# Patient Record
Sex: Female | Born: 2000 | Race: Black or African American | Hispanic: No | Marital: Single | Smoking: Never smoker
Health system: Southern US, Community
[De-identification: ages and names within clinical notes are randomized; demographics above are authoritative.]

---
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
## 2019-01-23 ENCOUNTER — Other Ambulatory Visit: Payer: Self-pay | Admitting: Family Medicine

## 2019-01-23 NOTE — Telephone Encounter (Signed)
Please schedule PE and refill until then  

## 2019-01-23 NOTE — Telephone Encounter (Signed)
No recent or future appts., please advise  

## 2019-01-23 NOTE — Telephone Encounter (Signed)
appt scheduled and med refilled. Mother does want to see if insurance will cover a virtual CPE so pt will not have to come into the office if she wants to change it I advised just call back and let us know

## 2019-02-12 ENCOUNTER — Ambulatory Visit (INDEPENDENT_AMBULATORY_CARE_PROVIDER_SITE_OTHER): Payer: Managed Care, Other (non HMO) | Admitting: Family Medicine

## 2019-02-12 ENCOUNTER — Encounter: Payer: Self-pay | Admitting: Family Medicine

## 2019-02-12 ENCOUNTER — Other Ambulatory Visit: Payer: Self-pay

## 2019-02-12 VITALS — BP 122/68 | HR 88 | Temp 98.0°F | Ht 64.75 in | Wt 152.2 lb

## 2019-02-12 DIAGNOSIS — Z113 Encounter for screening for infections with a predominantly sexual mode of transmission: Secondary | ICD-10-CM | POA: Insufficient documentation

## 2019-02-12 DIAGNOSIS — Z0000 Encounter for general adult medical examination without abnormal findings: Secondary | ICD-10-CM

## 2019-02-12 DIAGNOSIS — N926 Irregular menstruation, unspecified: Secondary | ICD-10-CM

## 2019-02-12 MED ORDER — LEVONORGESTREL-ETHINYL ESTRAD 0.1-20 MG-MCG PO TABS: 1.0000 | ORAL_TABLET | Freq: Every day | ORAL | 3 refills | Status: DC

## 2019-02-12 NOTE — Assessment & Plan Note (Signed)
Reviewed health habits including diet and exercise and skin cancer prevention Reviewed appropriate screening tests for age  Also reviewed health mt list, fam hx and immunization status , as well as social and family history   See HPI STD screening ordered (counseled on condom  Use)  Refilled OC Strongly enc HPV vaccine-she will consider it (given handout)  Disc safety at college, fitness, healthy habits and diet , avoid etoh and illicit drugs Non smoker- enc her to continue not to smoke

## 2019-02-12 NOTE — Progress Notes (Signed)
Subjective:    Patient ID: Mary Morrow, female    DOB: 06/27/2001, 18 y.o.   MRN: [id number]  HPI  Here for health maintenance exam and to review chronic medical problems    Weight  Wt Readings from Last 3 Encounters:  02/12/19 152 lb 4 oz (69.1 kg) (83 %, Z= 0.97)*  02/08/18 146 lb 12 oz (66.6 kg) (82 %, Z= 0.90)*  02/07/17 140 lb 12.8 oz (63.9 kg) (79 %, Z= 0.79)*   * Growth percentiles are based on CDC (Girls, 2-20 Years) data.   25.53 kg/m (83 %, Z= 0.94, Source: CDC (Girls, 2-20 Years))   Freshman year at [employer]  Enjoyed her year  Chemistry was hard   Going back to school a week earlier on campus  Unsure if they will transition to online  She is majoring in psychology   Home for the summer - spending time with family  Parents do not want her working outside the house  Would like an at home job   Flu shots -declines in the past   HPV status -has not had the vaccine  Still thinking about getting that   She has been sexually active  Would like std screening  No burning /itching / sore areas  Does use condoms   Tdap 11/10/10  Meningococcal vaccine 6/18   Menses- periods are more regular on OC -likes it  Not heavy/ last 3-4 days  Taking larissa 0.1-20 oral contraceptive   She is still exercising (home and at home) - staying in shape  Did no end up running track   Patient Active Problem List   Diagnosis Date Noted  . Screening examination for STD (sexually transmitted disease) 02/12/2019  . Routine general medical examination at a health care facility 02/08/2018  . Irregular menses 08/05/2014  . Well adolescent visit 08/05/2014   History reviewed. No pertinent past medical history. History reviewed. No pertinent surgical history. Social History   Tobacco Use  . Smoking status: Never Smoker  . Smokeless tobacco: Never Used  Substance Use Topics  . Alcohol use: No    Alcohol/week: 0.0 standard drinks  . Drug use: No   Family History  Problem  Relation Age of Onset  . Hypertension Maternal Grandmother   . Hyperlipidemia Maternal Uncle   . Mental illness Maternal Aunt   . Mental illness Maternal Uncle   . Diabetes Other        maternal great uncle  . Diabetes Maternal Grandfather    No Known Allergies Current Outpatient Medications on File Prior to Visit  Medication Sig Dispense Refill  . cetirizine (ZYRTEC) 10 MG tablet Take 10 mg by mouth daily.    Marland Kitchen loratadine (ALLERGY) 10 MG tablet Take 10 mg by mouth daily. Reported on 10/20/2015     No current facility-administered medications on file prior to visit.      Review of Systems  Constitutional: Negative for activity change, appetite change, fatigue, fever and unexpected weight change.  HENT: Negative for congestion, ear pain, rhinorrhea, sinus pressure and sore throat.   Eyes: Negative for pain, redness and visual disturbance.  Respiratory: Negative for cough, shortness of breath and wheezing.   Cardiovascular: Negative for chest pain and palpitations.  Gastrointestinal: Negative for abdominal pain, blood in stool, constipation and diarrhea.  Endocrine: Negative for polydipsia and polyuria.  Genitourinary: Negative for dysuria, frequency and urgency.       Occ clear vaginal d/c  Musculoskeletal: Negative for arthralgias, back pain and  myalgias.  Skin: Negative for pallor and rash.  Allergic/Immunologic: Negative for environmental allergies.  Neurological: Negative for dizziness, syncope and headaches.  Hematological: Negative for adenopathy. Does not bruise/bleed easily.  Psychiatric/Behavioral: Negative for decreased concentration and dysphoric mood. The patient is not nervous/anxious.        Objective:   Physical Exam Constitutional:      General: She is not in acute distress.    Appearance: Normal appearance. She is well-developed and normal weight.  HENT:     Head: Normocephalic and atraumatic.     Right Ear: Tympanic membrane, ear canal and external ear  normal.     Left Ear: Tympanic membrane, ear canal and external ear normal.     Nose: Nose normal.     Mouth/Throat:     Mouth: Mucous membranes are moist.     Pharynx: Oropharynx is clear. No posterior oropharyngeal erythema.  Eyes:     General: No scleral icterus.       Right eye: No discharge.        Left eye: No discharge.     Conjunctiva/sclera: Conjunctivae normal.     Pupils: Pupils are equal, round, and reactive to light.  Neck:     Musculoskeletal: Normal range of motion and neck supple. No muscular tenderness.     Thyroid: No thyromegaly.     Vascular: No carotid bruit or JVD.  Cardiovascular:     Rate and Rhythm: Normal rate and regular rhythm.     Heart sounds: Normal heart sounds. No gallop.   Pulmonary:     Effort: Pulmonary effort is normal. No respiratory distress.     Breath sounds: Normal breath sounds. No wheezing, rhonchi or rales.  Abdominal:     General: Bowel sounds are normal. There is no distension.     Palpations: Abdomen is soft. There is no mass.     Tenderness: There is no abdominal tenderness.  Musculoskeletal:        General: No tenderness.     Right lower leg: No edema.     Left lower leg: No edema.  Lymphadenopathy:     Cervical: No cervical adenopathy.  Skin:    General: Skin is warm and dry.     Coloration: Skin is not pale.     Findings: No erythema or rash.     Comments: Few brown nevi on back  Neurological:     General: No focal deficit present.     Mental Status: She is alert. Mental status is at baseline.     Cranial Nerves: No cranial nerve deficit.     Motor: No abnormal muscle tone.     Coordination: Coordination normal.     Gait: Gait normal.     Deep Tendon Reflexes: Reflexes are normal and symmetric. Reflexes normal.  Psychiatric:        Mood and Affect: Mood normal.     Comments: Cheerful and talkative           Assessment & Plan:   Problem List Items Addressed This Visit      Other   Irregular menses    Doing  well with current OC Refilled       Routine general medical examination at a health care facility - Primary    Reviewed health habits including diet and exercise and skin cancer prevention Reviewed appropriate screening tests for age  Also reviewed health mt list, fam hx and immunization status , as well as social and family history  See HPI STD screening ordered (counseled on condom  Use)  Refilled OC Strongly enc HPV vaccine-she will consider it (given handout)  Disc safety at college, fitness, healthy habits and diet , avoid etoh and illicit drugs Non smoker- enc her to continue not to smoke        Screening examination for STD (sexually transmitted disease)   Relevant Orders   HIV Antibody (routine testing w rflx)   RPR   C. trachomatis/N. gonorrhoeae RNA

## 2019-02-12 NOTE — Patient Instructions (Signed)
Please think about the HPV vaccine  Take care of yourself   STD screening today  Stay fit and make the effort to drink enough water!   Make sure to get enough rest and keep up a good study routine  Wear a mask in public   Use condoms if you are sexually active to prevent STDs   Continue the oral contraceptive

## 2019-02-12 NOTE — Assessment & Plan Note (Signed)
Doing well with current OC Refilled

## 2019-02-13 LAB — C. TRACHOMATIS/N. GONORRHOEAE RNA
C. trachomatis RNA, TMA: NOT DETECTED
N. gonorrhoeae RNA, TMA: NOT DETECTED

## 2019-02-13 LAB — RPR: RPR Ser Ql: NONREACTIVE

## 2019-02-13 LAB — HIV ANTIBODY (ROUTINE TESTING W REFLEX): HIV 1&2 Ab, 4th Generation: NONREACTIVE

## 2020-01-14 ENCOUNTER — Other Ambulatory Visit: Payer: Self-pay | Admitting: Family Medicine

## 2020-04-05 ENCOUNTER — Other Ambulatory Visit: Payer: Self-pay | Admitting: Family Medicine

## 2020-04-06 ENCOUNTER — Telehealth: Payer: Self-pay | Admitting: Family Medicine

## 2020-04-06 NOTE — Telephone Encounter (Signed)
Pt called wanting to schedule cpx.  She needs this before 8/23 she goes back to school then.  Can she be worked

## 2020-04-06 NOTE — Telephone Encounter (Signed)
Please work her in any 30 minute slot  Thanks

## 2020-04-07 NOTE — Telephone Encounter (Signed)
Patient scheduled appointment on 04/15/20.

## 2020-04-15 ENCOUNTER — Ambulatory Visit (INDEPENDENT_AMBULATORY_CARE_PROVIDER_SITE_OTHER): Payer: Managed Care, Other (non HMO) | Admitting: Family Medicine

## 2020-04-15 ENCOUNTER — Other Ambulatory Visit: Payer: Self-pay

## 2020-04-15 ENCOUNTER — Encounter: Payer: Self-pay | Admitting: Family Medicine

## 2020-04-15 VITALS — BP 98/64 | HR 78 | Temp 97.3°F | Ht 65.0 in | Wt 143.2 lb

## 2020-04-15 DIAGNOSIS — Z23 Encounter for immunization: Secondary | ICD-10-CM | POA: Diagnosis not present

## 2020-04-15 DIAGNOSIS — N926 Irregular menstruation, unspecified: Secondary | ICD-10-CM | POA: Diagnosis not present

## 2020-04-15 DIAGNOSIS — Z0000 Encounter for general adult medical examination without abnormal findings: Secondary | ICD-10-CM | POA: Diagnosis not present

## 2020-04-15 MED ORDER — LEVONORGESTREL-ETHINYL ESTRAD 0.1-20 MG-MCG PO TABS: 1.0000 | ORAL_TABLET | Freq: Every day | ORAL | 11 refills | Status: DC

## 2020-04-15 NOTE — Assessment & Plan Note (Signed)
Doing well with OC  Will continue it  Aware if does not prevent stds  Not a smoker

## 2020-04-15 NOTE — Patient Instructions (Addendum)
HPV vaccine today  Continue your current pill Eat a healthy balanced diet  - get some fruits and veggie  Keep exercising -this helps your brain as well as your body   Stay safe  Don't smoke

## 2020-04-15 NOTE — Assessment & Plan Note (Signed)
Reviewed health habits including diet and exercise and skin cancer prevention Reviewed appropriate screening tests for age  Also reviewed health mt list, fam hx and immunization status , as well as social and family history   See HPI First HPV vaccine given today Pt is covid vaccinated  Encouraged a flu vaccine in the fall  Declines STD screen due to not sexually active  Encouraged good health habits and balanced diet

## 2020-04-15 NOTE — Progress Notes (Signed)
Subjective:    Patient ID: Mary Morrow, female    DOB: [birth year], 19 y.o.   MRN: [id number]  This visit occurred during the SARS-CoV-2 public health emergency.  Safety protocols were in place, including screening questions prior to the visit, additional usage of staff PPE, and extensive cleaning of exam room while observing appropriate contact time as indicated for disinfecting solutions.    HPI Here for health maintenance exam and to review chronic medical problems    Wt Readings from Last 3 Encounters:  04/15/20 143 lb 4 oz (65 kg)  02/12/19 152 lb 4 oz (69.1 kg) (83 %, Z= 0.97)*  02/08/18 146 lb 12 oz (66.6 kg) (82 %, Z= 0.90)*   * Growth percentiles are based on CDC (Girls, 2-20 Years) data.   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  Learned a lot   Returning to [employer] on psychology  In [employer] club  Looking forward to it   She is eating healthy / some sweets  Working out this summer  Lost some [employer] to work out outdoors at school   Is covid vaccinated   Flu shot Tdap 3/12  HPV vaccine- will start the series   Menses - regular and not too heavy or painful  Tolerating them  Taking OC    larissa 1-20- wants to stick with that  Non smoker   HPV status -will get first vaccine today   STD screen -does not need  Not sexually active right now   No new family history   Patient Active Problem List   Diagnosis Date Noted  . Screening examination for STD (sexually transmitted disease) 02/12/2019  . Routine general medical examination at a health care facility 02/08/2018  . Irregular menses 08/05/2014  . Well adolescent visit 08/05/2014   History reviewed. No pertinent past medical history. History reviewed. No pertinent surgical history. Social History   Tobacco Use  . Smoking status: Never Smoker  . Smokeless tobacco: Never Used  Substance Use Topics  . Alcohol use:  No    Alcohol/week: 0.0 standard drinks  . Drug use: No   Family History  Problem Relation Age of Onset  . Hypertension Maternal Grandmother   . Hyperlipidemia Maternal Uncle   . Mental illness Maternal Aunt   . Mental illness Maternal Uncle   . Diabetes Other        maternal great uncle  . Diabetes Maternal Grandfather    No Known Allergies Current Outpatient Medications on File Prior to Visit  Medication Sig Dispense Refill  . loratadine (ALLERGY) 10 MG tablet Take 10 mg by mouth daily. Reported on 10/20/2015     No current facility-administered medications on file prior to visit.    Review of Systems  Constitutional: Negative for activity change, appetite change, fatigue, fever and unexpected weight change.  HENT: Negative for congestion, ear pain, rhinorrhea, sinus pressure and sore throat.   Eyes: Negative for pain, redness and visual disturbance.  Respiratory: Negative for cough, shortness of breath and wheezing.   Cardiovascular: Negative for chest pain and palpitations.  Gastrointestinal: Negative for abdominal pain, blood in stool, constipation and diarrhea.  Endocrine: Negative for polydipsia and polyuria.  Genitourinary: Negative for dysuria, frequency and urgency.  Musculoskeletal: Negative for arthralgias, back pain and myalgias.  Skin: Negative for pallor and rash.  Allergic/Immunologic: Negative for environmental allergies.  Neurological: Negative for dizziness, syncope  and headaches.  Hematological: Negative for adenopathy. Does not bruise/bleed easily.  Psychiatric/Behavioral: Negative for decreased concentration and dysphoric mood. The patient is not nervous/anxious.        Objective:   Physical Exam Constitutional:      General: She is not in acute distress.    Appearance: Normal appearance. She is well-developed and normal weight. She is not ill-appearing or diaphoretic.  HENT:     Head: Normocephalic and atraumatic.     Right Ear: Tympanic membrane,  ear canal and external ear normal.     Left Ear: Tympanic membrane, ear canal and external ear normal.     Nose: Nose normal. No congestion.     Mouth/Throat:     Mouth: Mucous membranes are moist.     Pharynx: Oropharynx is clear. No posterior oropharyngeal erythema.  Eyes:     General: No scleral icterus.    Extraocular Movements: Extraocular movements intact.     Conjunctiva/sclera: Conjunctivae normal.     Pupils: Pupils are equal, round, and reactive to light.  Neck:     Thyroid: No thyromegaly.     Vascular: No carotid bruit or JVD.  Cardiovascular:     Rate and Rhythm: Normal rate and regular rhythm.     Pulses: Normal pulses.     Heart sounds: Normal heart sounds. No gallop.   Pulmonary:     Effort: Pulmonary effort is normal. No respiratory distress.     Breath sounds: Normal breath sounds. No wheezing.     Comments: Good air exch Chest:     Chest wall: No tenderness.  Abdominal:     General: Bowel sounds are normal. There is no distension or abdominal bruit.     Palpations: Abdomen is soft. There is no mass.     Tenderness: There is no abdominal tenderness.     Hernia: No hernia is present.  Musculoskeletal:        General: No tenderness. Normal range of motion.     Cervical back: Normal range of motion and neck supple. No rigidity. No muscular tenderness.     Right lower leg: No edema.     Left lower leg: No edema.  Lymphadenopathy:     Cervical: No cervical adenopathy.  Skin:    General: Skin is warm and dry.     Coloration: Skin is not pale.     Findings: No erythema or rash.  Neurological:     Mental Status: She is alert. Mental status is at baseline.     Cranial Nerves: No cranial nerve deficit.     Motor: No abnormal muscle tone.     Coordination: Coordination normal.     Gait: Gait normal.     Deep Tendon Reflexes: Reflexes are normal and symmetric. Reflexes normal.  Psychiatric:        Mood and Affect: Mood normal.     Comments: Pleasant             Assessment & Plan:   Problem List Items Addressed This Visit      Other   Routine general medical examination at a health care facility - Primary (Chronic)    Reviewed health habits including diet and exercise and skin cancer prevention Reviewed appropriate screening tests for age  Also reviewed health mt list, fam hx and immunization status , as well as social and family history   See HPI First HPV vaccine given today Pt is covid vaccinated  Encouraged a flu vaccine in the fall  Declines STD screen due to not sexually active  Encouraged good health habits and balanced diet        Irregular menses    Doing well with OC  Will continue it  Aware if does not prevent stds  Not a smoker        Other Visit Diagnoses    Need for HPV vaccination       Relevant Orders   HPV 9-valent vaccine,Recombinat (Completed)

## 2020-04-25 ENCOUNTER — Telehealth: Payer: Self-pay | Admitting: Family Medicine

## 2020-04-26 MED ORDER — LEVONORGESTREL-ETHINYL ESTRAD 0.1-20 MG-MCG PO TABS: 1.0000 | ORAL_TABLET | Freq: Every day | ORAL | 11 refills | Status: DC

## 2020-04-26 NOTE — Telephone Encounter (Signed)
Pt called wanting to know why her rx was denied  Pt has 1 more week left of pills  Best number [phone]

## 2020-04-26 NOTE — Telephone Encounter (Signed)
Patient says she is still getting a message from [facility] [place] [facility] that this has not been received yet.  She does want the R/X to be for [facility] location.    I contacted them to see what the problem is as it is still showing a receipt on our end.  They said there must be something wrong with their system because it is not showing up on their end.   I did call in new r/X according to prescription:  Levonorgestrel-ethinyl estradiol Payton Mccallum) 0.1/20mg ,mcg 1 po qd #28, 11 refills.   Patient notified to check with the [place] pharmacy later today to pick up.

## 2020-04-26 NOTE — Telephone Encounter (Signed)
Rx was filled for a year but [facility] [place],  Left VM letting pt know and advise her that all she has to do is call pharmacy and have the xfer Rx [facility] [place] if that's the one she wants to use

## 2020-04-26 NOTE — Addendum Note (Signed)
Addended by: Barrington Ellison C on: 04/26/2020 03:10 PM   Modules accepted: Orders

## 2020-05-10 ENCOUNTER — Telehealth: Payer: Self-pay | Admitting: *Deleted

## 2020-05-10 NOTE — Telephone Encounter (Signed)
Patient's mom called wanting to know how her daughter should go about getting treated for her eczema.  Patient's mom was advised that she is not on the DPR and would have to discuss this with her daughter. Patient got on the phone and stated that she is having a flare-up with her eczema and this has been going on for about a week.. Patient stated that she has had eczema all of her life but has not been treated for this by Dr. Milinda Antis. Patient stated that she is in college and requested a virtual visit with Dr. Milinda Antis. Virtual visit scheduled for Wednesday 05/12/20 at 2:00 pm.  [facility]

## 2020-05-10 NOTE — Telephone Encounter (Signed)
Patient called back to cancel her virtual visit for Wednesday with Dr. Milinda Antis because her insurance will not cover it. Patient stated that she has scheduled a tele health visit thru her insurance so that they will cover it.

## 2020-05-12 ENCOUNTER — Telehealth: Payer: Managed Care, Other (non HMO) | Admitting: Family Medicine

## 2020-06-07 ENCOUNTER — Telehealth: Payer: Self-pay | Admitting: Family Medicine

## 2020-06-07 NOTE — Telephone Encounter (Signed)
Patient scheduled appointment on 06/16/20.

## 2020-06-07 NOTE — Telephone Encounter (Signed)
Pt called to schedule her HPV 2nd dose vaccine.  Please advise.  Thank you!

## 2020-06-07 NOTE — Telephone Encounter (Signed)
Needs appt 2 months after 1st does. 1st dose was on 04/15/20, so anytime after 06/15/20 is okay to schedule

## 2020-06-16 ENCOUNTER — Ambulatory Visit (INDEPENDENT_AMBULATORY_CARE_PROVIDER_SITE_OTHER): Payer: Managed Care, Other (non HMO)

## 2020-06-16 ENCOUNTER — Other Ambulatory Visit: Payer: Self-pay

## 2020-06-16 DIAGNOSIS — Z23 Encounter for immunization: Secondary | ICD-10-CM

## 2020-06-16 NOTE — Progress Notes (Signed)
Per orders of Dr. Roxy Manns, injection of second HPV given by Donnamarie Poag in right deltoid. Patient tolerated injection well. Patient will make appointment at 6 month. She would like to call later to make appointment.   VIS given to patient.

## 2020-12-22 ENCOUNTER — Other Ambulatory Visit: Payer: Self-pay | Admitting: Family Medicine

## 2021-01-06 ENCOUNTER — Ambulatory Visit: Payer: Managed Care, Other (non HMO) | Admitting: Family Medicine

## 2021-01-12 ENCOUNTER — Encounter: Payer: Self-pay | Admitting: Family Medicine

## 2021-01-12 ENCOUNTER — Other Ambulatory Visit: Payer: Self-pay

## 2021-01-12 ENCOUNTER — Ambulatory Visit (INDEPENDENT_AMBULATORY_CARE_PROVIDER_SITE_OTHER): Payer: Managed Care, Other (non HMO) | Admitting: Family Medicine

## 2021-01-12 VITALS — BP 114/62 | HR 67 | Temp 96.9°F | Ht 65.0 in | Wt 145.6 lb

## 2021-01-12 DIAGNOSIS — Z113 Encounter for screening for infections with a predominantly sexual mode of transmission: Secondary | ICD-10-CM

## 2021-01-12 NOTE — Progress Notes (Signed)
Subjective:    Patient ID: Mary Morrow, female    DOB: 2000/09/09, 20 y.o.   MRN: [id number]  This visit occurred during the SARS-CoV-2 public health emergency.  Safety protocols were in place, including screening questions prior to the visit, additional usage of staff PPE, and extensive cleaning of exam room while observing appropriate contact time as indicated for disinfecting solutions.    HPI Pt presents to discuss contraception and STD screening   Wt Readings from Last 3 Encounters:  01/12/21 145 lb 9 oz (66 kg)  04/15/20 143 lb 4 oz (65 kg)  02/12/19 152 lb 4 oz (69.1 kg) (83 %, Z= 0.97)*   * Growth percentiles are based on CDC (Girls, 2-20 Years) data.   24.22 kg/m  She had an incident with a partner who was diagnosed with gonorrhea  This was middle of April  Not using condoms   Had one partner prior  Currently none   She has not had any symptoms  No vaginal discomfort or discharge  Once she urinated and it felt a little funny - but did not happen again  No frequency or urgency   H/o irregular menses  Taking larissa 0.1-20 mg  Has menses right now  Regular and not too long or painful    BP Readings from Last 3 Encounters:  01/12/21 114/62  04/15/20 98/64  02/12/19 122/68   Is HPV immunized -2  Patient Active Problem List   Diagnosis Date Noted  . Screening examination for STD (sexually transmitted disease) 02/12/2019  . Routine general medical examination at a health care facility 02/08/2018  . Irregular menses 08/05/2014  . Well adolescent visit 08/05/2014   No past medical history on file. No past surgical history on file. Social History   Tobacco Use  . Smoking status: Never Smoker  . Smokeless tobacco: Never Used  Substance Use Topics  . Alcohol use: No    Alcohol/week: 0.0 standard drinks  . Drug use: No   Family History  Problem Relation Age of Onset  . Hypertension Maternal Grandmother   . Hyperlipidemia Maternal Uncle   . Mental  illness Maternal Aunt   . Mental illness Maternal Uncle   . Diabetes Other        maternal great uncle  . Diabetes Maternal Grandfather    No Known Allergies Current Outpatient Medications on File Prior to Visit  Medication Sig Dispense Refill  . LARISSIA 0.1-20 MG-MCG tablet TAKE 1 TABLET EVERY DAY 84 tablet 0  . loratadine (CLARITIN) 10 MG tablet Take 10 mg by mouth daily. Reported on 10/20/2015     No current facility-administered medications on file prior to visit.    Review of Systems  Constitutional: Negative for activity change, appetite change, fatigue, fever and unexpected weight change.  HENT: Negative for congestion, ear pain, rhinorrhea, sinus pressure and sore throat.   Eyes: Negative for pain, redness and visual disturbance.  Respiratory: Negative for cough, shortness of breath and wheezing.   Cardiovascular: Negative for chest pain and palpitations.  Gastrointestinal: Negative for abdominal pain, blood in stool, constipation and diarrhea.  Endocrine: Negative for polydipsia and polyuria.  Genitourinary: Negative for difficulty urinating, dysuria, flank pain, frequency, genital sores, hematuria, menstrual problem, pelvic pain, urgency, vaginal discharge and vaginal pain.  Musculoskeletal: Negative for arthralgias, back pain and myalgias.  Skin: Negative for pallor and rash.  Allergic/Immunologic: Negative for environmental allergies.  Neurological: Negative for dizziness, syncope and headaches.  Hematological: Negative for adenopathy. Does not bruise/bleed  easily.  Psychiatric/Behavioral: Negative for decreased concentration and dysphoric mood. The patient is not nervous/anxious.        Objective:   Physical Exam Constitutional:      General: She is not in acute distress.    Appearance: Normal appearance. She is normal weight. She is not ill-appearing.  Eyes:     Conjunctiva/sclera: Conjunctivae normal.     Pupils: Pupils are equal, round, and reactive to light.   Cardiovascular:     Rate and Rhythm: Normal rate and regular rhythm.     Heart sounds: Normal heart sounds.  Pulmonary:     Effort: No respiratory distress.  Abdominal:     General: Abdomen is flat. Bowel sounds are normal. There is no distension.     Palpations: Abdomen is soft. There is no mass.     Tenderness: There is no abdominal tenderness.     Comments: No suprapubic tenderness or fullness    Musculoskeletal:     Cervical back: Neck supple.  Lymphadenopathy:     Cervical: No cervical adenopathy.  Skin:    Findings: No erythema or rash.  Neurological:     Mental Status: She is alert.  Psychiatric:        Mood and Affect: Mood normal.           Assessment & Plan:   Problem List Items Addressed This Visit      Other   Screening examination for STD (sexually transmitted disease) - Primary    Pt desires screening  2 lifetime partners (none since April)  ? If exp to gonorrhea Disc screening  Gc/chl and HIV, hep C and RPR today  Disc s/s to watch for   Is HPV immunized  Due for annual in aug -will need first pap since she will be 20  Disc safe sexual practices/condom use  Continues OC      Relevant Orders   RPR   HIV Antibody (routine testing w rflx)   Hepatitis C antibody   C. trachomatis/N. gonorrhoeae RNA

## 2021-01-12 NOTE — Assessment & Plan Note (Signed)
Pt desires screening  2 lifetime partners (none since April)  ? If exp to gonorrhea Disc screening  Gc/chl and HIV, hep C and RPR today  Disc s/s to watch for   Is HPV immunized  Due for annual in aug -will need first pap since she will be 20  Disc safe sexual practices/condom use  Continues OC

## 2021-01-12 NOTE — Patient Instructions (Addendum)
Schedule your yearly visit after august 19th  We will start doing pap tests now at age 20   STD screening today  Please use condoms if sexually active

## 2021-01-13 LAB — HEPATITIS C ANTIBODY
Hepatitis C Ab: NONREACTIVE
SIGNAL TO CUT-OFF: 0.04 (ref <1.00)

## 2021-01-13 LAB — RPR: RPR Ser Ql: NONREACTIVE

## 2021-01-13 LAB — C. TRACHOMATIS/N. GONORRHOEAE RNA
C. trachomatis RNA, TMA: DETECTED — AB
N. gonorrhoeae RNA, TMA: DETECTED — AB

## 2021-01-13 LAB — HIV ANTIBODY (ROUTINE TESTING W REFLEX): HIV 1&2 Ab, 4th Generation: NONREACTIVE

## 2021-01-13 MED ORDER — DOXYCYCLINE HYCLATE 100 MG PO TABS: 100.0000 mg | ORAL_TABLET | Freq: Two times a day (BID) | ORAL | 0 refills | Status: DC

## 2021-01-13 NOTE — Addendum Note (Signed)
Addended by: Roena Malady on: 01/13/2021 04:14 PM   Modules accepted: Orders

## 2021-01-14 ENCOUNTER — Ambulatory Visit (INDEPENDENT_AMBULATORY_CARE_PROVIDER_SITE_OTHER): Payer: Managed Care, Other (non HMO)

## 2021-01-14 ENCOUNTER — Other Ambulatory Visit: Payer: Self-pay

## 2021-01-14 DIAGNOSIS — A549 Gonococcal infection, unspecified: Secondary | ICD-10-CM | POA: Diagnosis not present

## 2021-01-14 MED ORDER — CEFTRIAXONE SODIUM 250 MG IJ SOLR
500.0000 mg | Freq: Once | INTRAMUSCULAR | Status: AC
  Administered 2021-01-14: 500 mg via INTRAMUSCULAR

## 2021-01-14 NOTE — Progress Notes (Signed)
Per orders of Dr. Milinda Antis , injection of Rocephin 500 mg  given by Roena Malady. In RUOQ  Patient tolerated injection well.

## 2021-02-14 ENCOUNTER — Ambulatory Visit: Payer: Managed Care, Other (non HMO) | Admitting: Family Medicine

## 2021-02-14 ENCOUNTER — Other Ambulatory Visit: Payer: Self-pay

## 2021-02-14 ENCOUNTER — Ambulatory Visit (INDEPENDENT_AMBULATORY_CARE_PROVIDER_SITE_OTHER): Payer: Managed Care, Other (non HMO) | Admitting: Family Medicine

## 2021-02-14 ENCOUNTER — Encounter: Payer: Self-pay | Admitting: Family Medicine

## 2021-02-14 VITALS — BP 96/64 | HR 78 | Temp 98.7°F | Ht 65.0 in | Wt 143.0 lb

## 2021-02-14 DIAGNOSIS — Z113 Encounter for screening for infections with a predominantly sexual mode of transmission: Secondary | ICD-10-CM

## 2021-02-14 DIAGNOSIS — N926 Irregular menstruation, unspecified: Secondary | ICD-10-CM

## 2021-02-14 MED ORDER — LEVONORGESTREL-ETHINYL ESTRAD 0.1-20 MG-MCG PO TABS
1.0000 | ORAL_TABLET | Freq: Every day | ORAL | 3 refills | Status: DC
Start: 2021-02-14 — End: 2021-11-23

## 2021-02-14 NOTE — Patient Instructions (Signed)
Use condoms   Re test for gonorrhea and chlamydia   Follow up for first pap when you can

## 2021-02-14 NOTE — Progress Notes (Signed)
Subjective:    Patient ID: Mary Morrow, female    DOB: 2000/09/02, 20 y.o.   MRN: [id number]  This visit occurred during the SARS-CoV-2 public health emergency.  Safety protocols were in place, including screening questions prior to the visit, additional usage of staff PPE, and extensive cleaning of exam room while observing appropriate contact time as indicated for disinfecting solutions.   HPI Pt presents for STD screening   Wt Readings from Last 3 Encounters:  02/14/21 143 lb (64.9 kg)  01/12/21 145 lb 9 oz (66 kg)  04/15/20 143 lb 4 oz (65 kg)   23.80 kg/m    At visit a month ago- screen was positive for GC and chlamydia  Treated with rocephin IM injection 500 mg and doxycycline 100 mg bid   Feels back to normal now after treating  Her partner got test and no longer in the picture  [place] OC -doing well with that  Periods are regular  Due for first pap but can't stay today as she will be late for work   Will return later this summer  Office Visit on 01/12/2021  Component Date Value Ref Range Status   RPR Ser Ql 01/12/2021 NON-REACTIVE  NON-REACTIVE Final   HIV 1&2 Ab, 4th Generation 01/12/2021 NON-REACTIVE  NON-REACTIVE Final   Comment: HIV-1 antigen and HIV-1/HIV-2 antibodies were not detected. There is no laboratory evidence of HIV infection. Marland Kitchen PLEASE NOTE: This information has been disclosed to you from records whose confidentiality may be protected by state law.  If your state requires such protection, then the state law prohibits you from making any further disclosure of the information without the specific written consent of the person to whom it pertains, or as otherwise permitted by law. A general authorization for the release of medical or other information is NOT sufficient for this purpose. . For additional information please refer to http://education.quest[facility].com/faq/FAQ106 (This link is being provided for informational/ educational  purposes only.) . Marland Kitchen The performance of this assay has not been clinically validated in patients less than 55 years old. .    Hepatitis C Ab 01/12/2021 NON-REACTIVE  NON-REACTIVE Final   SIGNAL TO CUT-OFF 01/12/2021 0.04  <1.00 Final   Comment: . HCV antibody was non-reactive. There is no laboratory  evidence of HCV infection. . In most cases, no further action is required. However, if recent HCV exposure is suspected, a test for HCV RNA (test code 73220) is suggested. . For additional information please refer to http://education.[facility].com/faq/FAQ22v1 (This link is being provided for informational/ educational purposes only.) .    C. trachomatis RNA, TMA 01/12/2021 DETECTED (A) NOT DETECTED Final   Comment: . If results do not correlate with clinical findings, testing using an alternate molecular target which amplifies different genetic sequences can be performed on the same sample for result confirmation within 7 days of sample receipt or per performing laboratory specimen retention policy. Alternate target testing is available; 15031 (C. trachomatis) or 25427 (N. gonorrhoeae). .    N. gonorrhoeae RNA, TMA 01/12/2021 DETECTED (A) NOT DETECTED Final   Comment: . If results do not correlate with clinical findings, testing using an alternate molecular target which amplifies different genetic sequences can be performed on the same sample for result confirmation within 7 days of sample receipt or per performing laboratory specimen retention policy. Alternate target testing is available; 15031 (C. trachomatis) or 06237 (N. gonorrhoeae). . The analytical performance characteristics of this assay, when used to test SurePath(TM) specimens have been determined  by [employer]. The modifications have not been cleared or approved by the FDA. This assay has been validated pursuant to the CLIA regulations and is used for clinical purposes. . For additional  information, please refer to https://education.quest[facility].com/faq/FAQ154 (This link is being provided for information/ educational purposes only.) .      Patient Active Problem List   Diagnosis Date Noted   Screening examination for STD (sexually transmitted disease) 02/12/2019   Routine general medical examination at a health care facility 02/08/2018   Irregular menses 08/05/2014   Well adolescent visit 08/05/2014   History reviewed. No pertinent past medical history. History reviewed. No pertinent surgical history. Social History   Tobacco Use   Smoking status: Never   Smokeless tobacco: Never  Substance Use Topics   Alcohol use: No    Alcohol/week: 0.0 standard drinks   Drug use: No   Family History  Problem Relation Age of Onset   Hypertension Maternal Grandmother    Hyperlipidemia Maternal Uncle    Mental illness Maternal Aunt    Mental illness Maternal Uncle    Diabetes Other        maternal great uncle   Diabetes Maternal Grandfather    No Known Allergies No current outpatient medications on file prior to visit.   No current facility-administered medications on file prior to visit.     Review of Systems  Constitutional:  Negative for activity change, appetite change, fatigue, fever and unexpected weight change.  HENT:  Negative for congestion, ear pain, rhinorrhea, sinus pressure and sore throat.   Eyes:  Negative for pain, redness and visual disturbance.  Respiratory:  Negative for cough, shortness of breath and wheezing.   Cardiovascular:  Negative for chest pain and palpitations.  Gastrointestinal:  Negative for abdominal pain, blood in stool, constipation and diarrhea.  Endocrine: Negative for polydipsia and polyuria.  Genitourinary:  Negative for dysuria, frequency, urgency, vaginal bleeding, vaginal discharge and vaginal pain.  Musculoskeletal:  Negative for arthralgias, back pain and myalgias.  Skin:  Negative for pallor and rash.   Allergic/Immunologic: Negative for environmental allergies.  Neurological:  Negative for dizziness, syncope and headaches.  Hematological:  Negative for adenopathy. Does not bruise/bleed easily.  Psychiatric/Behavioral:  Negative for decreased concentration and dysphoric mood. The patient is not nervous/anxious.       Objective:   Physical Exam Constitutional:      General: She is not in acute distress.    Appearance: Normal appearance. She is normal weight. She is not ill-appearing.  Eyes:     General: No scleral icterus.    Conjunctiva/sclera: Conjunctivae normal.     Pupils: Pupils are equal, round, and reactive to light.  Cardiovascular:     Rate and Rhythm: Normal rate and regular rhythm.     Heart sounds: Normal heart sounds.  Pulmonary:     Effort: Pulmonary effort is normal. No respiratory distress.  Abdominal:     General: Abdomen is flat. Bowel sounds are normal.     Comments: No suprapubic tenderness or fullness    Musculoskeletal:     Cervical back: Normal range of motion and neck supple.  Lymphadenopathy:     Cervical: No cervical adenopathy.  Skin:    Coloration: Skin is not pale.     Findings: No rash.  Neurological:     Mental Status: She is alert.  Psychiatric:        Mood and Affect: Mood normal.          Assessment &  Plan:   Problem List Items Addressed This Visit       Other   Irregular menses    Much improved with the larissa OC         Screening examination for STD (sexually transmitted disease) - Primary    Last visit treated for gc and chlamydia Other tests neg  Was given rocephin and a week of doxycycline  Encouraged condom use  Not active right now  Will f/u for gyn /pap later in summer/ is due at age 46        Relevant Orders   C. trachomatis/N. gonorrhoeae RNA

## 2021-02-14 NOTE — Assessment & Plan Note (Signed)
Much improved with the larissa OC

## 2021-02-14 NOTE — Assessment & Plan Note (Signed)
Last visit treated for gc and chlamydia Other tests neg  Was given rocephin and a week of doxycycline  Encouraged condom use  Not active right now  Will f/u for gyn /pap later in summer/ is due at age 20

## 2021-02-15 LAB — C. TRACHOMATIS/N. GONORRHOEAE RNA
C. trachomatis RNA, TMA: NOT DETECTED
N. gonorrhoeae RNA, TMA: NOT DETECTED

## 2021-04-19 ENCOUNTER — Encounter: Payer: Managed Care, Other (non HMO) | Admitting: Family Medicine

## 2021-11-23 ENCOUNTER — Other Ambulatory Visit: Payer: Self-pay | Admitting: Family Medicine

## 2022-02-20 ENCOUNTER — Other Ambulatory Visit: Payer: Self-pay | Admitting: Family Medicine

## 2022-04-20 ENCOUNTER — Encounter: Payer: Managed Care, Other (non HMO) | Admitting: Family Medicine

## 2022-05-08 ENCOUNTER — Encounter: Payer: Self-pay | Admitting: Family Medicine

## 2022-05-08 ENCOUNTER — Ambulatory Visit (INDEPENDENT_AMBULATORY_CARE_PROVIDER_SITE_OTHER): Payer: Managed Care, Other (non HMO) | Admitting: Family Medicine

## 2022-05-08 ENCOUNTER — Other Ambulatory Visit (HOSPITAL_COMMUNITY)
Admission: RE | Admit: 2022-05-08 | Discharge: 2022-05-08 | Disposition: A | Discharge status: Home / Self Care | Payer: Managed Care, Other (non HMO) | Source: Ambulatory Visit | Attending: Family Medicine | Admitting: Family Medicine

## 2022-05-08 VITALS — BP 126/68 | HR 70 | Temp 98.0°F | Ht 65.0 in | Wt 148.1 lb

## 2022-05-08 DIAGNOSIS — Z01419 Encounter for gynecological examination (general) (routine) without abnormal findings: Secondary | ICD-10-CM | POA: Insufficient documentation

## 2022-05-08 DIAGNOSIS — Z23 Encounter for immunization: Secondary | ICD-10-CM

## 2022-05-08 DIAGNOSIS — Z0000 Encounter for general adult medical examination without abnormal findings: Secondary | ICD-10-CM | POA: Diagnosis not present

## 2022-05-08 DIAGNOSIS — D696 Thrombocytopenia, unspecified: Secondary | ICD-10-CM | POA: Insufficient documentation

## 2022-05-08 LAB — COMPREHENSIVE METABOLIC PANEL
ALT: 21 U/L (ref 0–35)
AST: 25 U/L (ref 0–37)
Albumin: 4.1 g/dL (ref 3.5–5.2)
Alkaline Phosphatase: 41 U/L (ref 39–117)
BUN: 10 mg/dL (ref 6–23)
CO2: 26 mEq/L (ref 19–32)
Calcium: 9.7 mg/dL (ref 8.4–10.5)
Chloride: 103 mEq/L (ref 96–112)
Creatinine, Ser: 0.93 mg/dL (ref 0.40–1.20)
GFR: 87.37 mL/min (ref >60.00)
Glucose, Bld: 73 mg/dL (ref 70–99)
Potassium: 3.9 mEq/L (ref 3.5–5.1)
Sodium: 135 mEq/L (ref 135–145)
Total Bilirubin: 0.5 mg/dL (ref 0.2–1.2)
Total Protein: 7.6 g/dL (ref 6.0–8.3)

## 2022-05-08 LAB — LIPID PANEL
Cholesterol: 181 mg/dL (ref 0–200)
HDL: 64.4 mg/dL (ref >39.00)
LDL Cholesterol: 101 mg/dL — ABNORMAL HIGH (ref 0–99)
NonHDL: 116.41
Total CHOL/HDL Ratio: 3
Triglycerides: 79 mg/dL (ref 0.0–149.0)
VLDL: 15.8 mg/dL (ref 0.0–40.0)

## 2022-05-08 LAB — CBC WITH DIFFERENTIAL/PLATELET
Basophils Absolute: 0 10*3/uL (ref 0.0–0.1)
Basophils Relative: 0.5 % (ref 0.0–3.0)
Eosinophils Absolute: 0.1 10*3/uL (ref 0.0–0.7)
Eosinophils Relative: 2.8 % (ref 0.0–5.0)
HCT: 37.9 % (ref 36.0–46.0)
Hemoglobin: 12.6 g/dL (ref 12.0–15.0)
Lymphocytes Relative: 48.3 % — ABNORMAL HIGH (ref 12.0–46.0)
Lymphs Abs: 1.6 10*3/uL (ref 0.7–4.0)
MCHC: 33.2 g/dL (ref 30.0–36.0)
MCV: 90.5 fl (ref 78.0–100.0)
Monocytes Absolute: 0.2 10*3/uL (ref 0.1–1.0)
Monocytes Relative: 5.7 % (ref 3.0–12.0)
Neutro Abs: 1.4 10*3/uL (ref 1.4–7.7)
Neutrophils Relative %: 42.7 % — ABNORMAL LOW (ref 43.0–77.0)
Platelets: 136 10*3/uL — ABNORMAL LOW (ref 150.0–400.0)
RBC: 4.19 Mil/uL (ref 3.87–5.11)
RDW: 13.3 % (ref 11.5–15.5)
WBC: 3.3 10*3/uL — ABNORMAL LOW (ref 4.0–10.5)

## 2022-05-08 LAB — TSH: TSH: 1.99 u[IU]/mL (ref 0.35–5.50)

## 2022-05-08 MED ORDER — LEVONORGESTREL-ETHINYL ESTRAD 0.1-20 MG-MCG PO TABS: 1.0000 | ORAL_TABLET | Freq: Every day | ORAL | 3 refills | Status: DC

## 2022-05-08 NOTE — Patient Instructions (Addendum)
HPV  vaccine today  Tdap vaccine today   Pap today  You will get a result in the next 1-2 weeks   Lab today   Try to eat a balance diet and get exercise  Use sun protection   Start back on your oral contraceptive   Use condoms for STD prevention

## 2022-05-08 NOTE — Assessment & Plan Note (Signed)
Reviewed health habits including diet and exercise and skin cancer prevention Reviewed appropriate screening tests for age  Also reviewed health mt list, fam hx and immunization status , as well as social and family history   See HPI Labs ordered  HPV vaccine and Tdap updated today  Declines flu shot  Fist pap and gyn exam today  Declines STD screening  Encouraged good self care

## 2022-05-08 NOTE — Progress Notes (Signed)
Subjective:    Patient ID: Mary Morrow, female    DOB: [birth year], 21 y.o.   MRN: [id number]  HPI Here for health maintenance exam and to review chronic medical problems    Wt Readings from Last 3 Encounters:  05/08/22 148 lb 2 oz (67.2 kg)  02/14/21 143 lb (64.9 kg)  01/12/21 145 lb 9 oz (66 kg)   24.65 kg/m  Recently graduated-thrilled / majored in psychology  Looking for a job  (interested in internship- in Physicist, medical)   Still living with her parents  Wants to continue school in future as well  Interested in behavioral med /would like to be a Investment banker, operational  Social work is another option    Taking fair care of herself  Has not worked out in a while (really likes it)  Can work out at home- wants to get started/getting motivated   Works as a Programme researcher, broadcasting/film/video- and gets her steps in   [place]- whatever is at home  Lot of grains  Balanced meals  Fruits and veggies     Immunization History  Administered Date(s) Administered   DTaP 03/19/2000, 05/22/2000, 07/27/2000, 04/23/2001, 10/17/2004   HIB (PRP-OMP) 03/19/2000, 05/22/2000, 07/27/2000, 04/23/2001   HPV 9-valent 04/15/2020, 06/16/2020   Hepatitis A, Ped/Adol-2 Dose 10/02/2007, 11/10/2010   Hepatitis B, PED/ADOLESCENT 12/25/1999, 02/20/2000, 10/23/2000   IPV 03/19/2000, 05/22/2000, 10/23/2000, 10/17/2004   MMR 01/18/2001, 10/17/2004   Meningococcal Mcv4o 02/07/2017   PFIZER(Purple Top)SARS-COV-2 Vaccination 02/10/2020, 03/02/2020   Pneumococcal Conjugate-13 03/19/2000, 05/22/2000, 07/27/2000, 01/18/2001   Tdap 11/10/2010   Varicella 01/18/2001, 10/17/2004, 10/02/2006   Health Maintenance Due  Topic Date Due   COVID-19 Vaccine (3 - Pfizer series) 04/27/2020   HPV VACCINES (3 - 3-dose series) 10/17/2020   TETANUS/TDAP  11/09/2020   PAP-Cervical Cytology Screening  Never done   PAP SMEAR-Modifier  Never done   INFLUENZA VACCINE  Never done   Flu shot -declines    HPV -had 2 vaccines   Tdap 10/2010-  due   Pap -has not had  Not on period  Not sexually active /last in feb   Menses: ok, out of pill for 2 wk  Was on OC in past - would like to re start  Regular  5-6 d  Not too heavy   STD screening - declines   BP Readings from Last 3 Encounters:  05/08/22 126/68  02/14/21 96/64  01/12/21 114/62   Pulse Readings from Last 3 Encounters:  05/08/22 70  02/14/21 78  01/12/21 67   Patient Active Problem List   Diagnosis Date Noted   Encounter for routine gynecological examination 05/08/2022   Screening examination for STD (sexually transmitted disease) 02/12/2019   Routine general medical examination at a health care facility 02/08/2018   Irregular menses 08/05/2014   Well adolescent visit 08/05/2014   No past medical history on file. No past surgical history on file. Social History   Tobacco Use   Smoking status: Never   Smokeless tobacco: Never  Substance Use Topics   Alcohol use: No    Alcohol/week: 0.0 standard drinks of alcohol   Drug use: No   Family History  Problem Relation Age of Onset   Hypertension Maternal Grandmother    Hyperlipidemia Maternal Uncle    Mental illness Maternal Aunt    Mental illness Maternal Uncle    Diabetes Other        maternal great uncle   Diabetes Maternal Grandfather    No Known Allergies No current  outpatient medications on file prior to visit.   No current facility-administered medications on file prior to visit.      Review of Systems  Constitutional:  Negative for activity change, appetite change, fatigue, fever and unexpected weight change.  HENT:  Negative for congestion, ear pain, rhinorrhea, sinus pressure and sore throat.   Eyes:  Negative for pain, redness and visual disturbance.  Respiratory:  Negative for cough, shortness of breath and wheezing.   Cardiovascular:  Negative for chest pain and palpitations.  Gastrointestinal:  Negative for abdominal pain, blood in stool, constipation and diarrhea.   Endocrine: Negative for polydipsia and polyuria.  Genitourinary:  Negative for dysuria, frequency and urgency.  Musculoskeletal:  Negative for arthralgias, back pain and myalgias.  Skin:  Negative for pallor and rash.  Allergic/Immunologic: Negative for environmental allergies.  Neurological:  Negative for dizziness, syncope and headaches.  Hematological:  Negative for adenopathy. Does not bruise/bleed easily.  Psychiatric/Behavioral:  Negative for decreased concentration and dysphoric mood. The patient is not nervous/anxious.        Objective:   Physical Exam Constitutional:      General: She is not in acute distress.    Appearance: Normal appearance. She is well-developed and normal weight. She is not ill-appearing or diaphoretic.  HENT:     Head: Normocephalic and atraumatic.     Right Ear: Tympanic membrane, ear canal and external ear normal.     Left Ear: Tympanic membrane, ear canal and external ear normal.     Nose: Nose normal. No congestion.     Mouth/Throat:     Mouth: Mucous membranes are moist.     Pharynx: Oropharynx is clear. No posterior oropharyngeal erythema.  Eyes:     General: No scleral icterus.    Extraocular Movements: Extraocular movements intact.     Conjunctiva/sclera: Conjunctivae normal.     Pupils: Pupils are equal, round, and reactive to light.  Neck:     Thyroid: No thyromegaly.     Vascular: No carotid bruit or JVD.  Cardiovascular:     Rate and Rhythm: Normal rate and regular rhythm.     Pulses: Normal pulses.     Heart sounds: Normal heart sounds.     No gallop.  Pulmonary:     Effort: Pulmonary effort is normal. No respiratory distress.     Breath sounds: Normal breath sounds. No wheezing.     Comments: Good air exch Chest:     Chest wall: No tenderness.  Abdominal:     General: Bowel sounds are normal. There is no distension or abdominal bruit.     Palpations: Abdomen is soft. There is no mass.     Tenderness: There is no abdominal  tenderness.     Hernia: No hernia is present.  Genitourinary:    Comments: Breast exam: No mass, nodules, thickening, tenderness, bulging, retraction, inflamation, nipple discharge or skin changes noted.  No axillary or clavicular LA.                   Anus appears normal w/o hemorrhoids or masses       External genitalia : nl appearance and hair distribution/no lesions       Urethral meatus : nl size, no lesions or prolapse       Urethra: no masses, tenderness or scarring      Bladder : no masses or tenderness       Vagina: nl general appearance, no discharge or  Lesions, no significant cystocele  or rectocele       Cervix: incompletely visualized due to pt tensing up during exam      Uterus: nl size, contour, position, and mobility (not fixed) , non tender      Adnexa : no masses, tenderness, enlargement or nodularity          Musculoskeletal:        General: No tenderness. Normal range of motion.     Cervical back: Normal range of motion and neck supple. No rigidity. No muscular tenderness.     Right lower leg: No edema.     Left lower leg: No edema.     Comments: No kyphosis   Lymphadenopathy:     Cervical: No cervical adenopathy.  Skin:    General: Skin is warm and dry.     Coloration: Skin is not pale.     Findings: No erythema or rash.  Neurological:     Mental Status: She is alert. Mental status is at baseline.     Cranial Nerves: No cranial nerve deficit.     Motor: No abnormal muscle tone.     Coordination: Coordination normal.     Gait: Gait normal.     Deep Tendon Reflexes: Reflexes are normal and symmetric.  Psychiatric:        Mood and Affect: Mood normal.        Cognition and Memory: Cognition and memory normal.     Comments: Pt became anxious during pelvic exam and tensed up   Otherwise nl affect            Assessment & Plan:   Problem List Items Addressed This Visit       Other   Routine general medical examination at a health care facility -  Primary (Chronic)    Reviewed health habits including diet and exercise and skin cancer prevention Reviewed appropriate screening tests for age  Also reviewed health mt list, fam hx and immunization status , as well as social and family history   See HPI Labs ordered  HPV vaccine and Tdap updated today  Declines flu shot  Fist pap and gyn exam today  Declines STD screening  Encouraged good self care       Relevant Orders   TSH (Completed)   Lipid panel (Completed)   Comprehensive metabolic panel (Completed)   CBC with Differential/Platelet (Completed)   HPV 9-valent vaccine,Recombinat (Completed)   Tdap vaccine greater than or equal to 7yo IM (Completed)   Encounter for routine gynecological examination    Exam and first pap today  Will continue 1/20 oral contraceptive Pt declines STD screening  Recommend condom use for STD prevention in the future       Relevant Orders   Cytology - PAP([facility])   Other Visit Diagnoses     Need for HPV vaccination       Relevant Orders   HPV 9-valent vaccine,Recombinat (Completed)   Need for Tdap vaccination       Relevant Orders   Tdap vaccine greater than or equal to 7yo IM (Completed)

## 2022-05-08 NOTE — Assessment & Plan Note (Signed)
Exam and first pap today  Will continue 1/20 oral contraceptive Pt declines STD screening  Recommend condom use for STD prevention in the future

## 2022-05-10 ENCOUNTER — Encounter: Payer: Self-pay | Admitting: *Deleted

## 2022-05-10 ENCOUNTER — Telehealth: Payer: Self-pay | Admitting: *Deleted

## 2022-05-10 NOTE — Telephone Encounter (Signed)
-----   Message from Judy Pimple, MD sent at 05/08/2022  7:53 PM EDT ----- Cholesterol is good  Your platelet count (part of blood that is responsible for clotting) is slightly low and I want to watch it  Let me know if you have any issues with bleeding or bruising  I want to re check this in about a month with an iron level  Other labs look normal

## 2022-05-10 NOTE — Telephone Encounter (Signed)
Left VM requesting pt to call the office back 

## 2022-05-15 LAB — CYTOLOGY - PAP
Adequacy: ABSENT
Comment: NEGATIVE
High risk HPV: POSITIVE — AB

## 2022-05-17 ENCOUNTER — Telehealth: Payer: Self-pay | Admitting: Family Medicine

## 2022-05-17 DIAGNOSIS — R87612 Low grade squamous intraepithelial lesion on cytologic smear of cervix (LGSIL): Secondary | ICD-10-CM | POA: Insufficient documentation

## 2022-05-17 NOTE — Telephone Encounter (Signed)
-----   Message from Tammi Sou, [place] sent at 05/17/2022  4:20 PM EDT ----- Pt notified of pap smear results and Dr. Marliss Coots comments. Pt agreed with referral to GYN she would like to see someone in [place]. Pt advised referrals can take up to 2 weeks but if she hasn't heard anything in 2 weeks to let us know.

## 2022-05-18 NOTE — Telephone Encounter (Signed)
Addressed through result notes  

## 2022-06-15 ENCOUNTER — Encounter: Payer: Self-pay | Admitting: Obstetrics and Gynecology

## 2022-09-14 ENCOUNTER — Ambulatory Visit: Payer: Managed Care, Other (non HMO) | Admitting: Obstetrics & Gynecology

## 2022-09-14 ENCOUNTER — Encounter: Payer: Self-pay | Admitting: Obstetrics & Gynecology

## 2022-09-14 VITALS — BP 110/70 | Ht 65.0 in | Wt 143.0 lb

## 2022-09-14 DIAGNOSIS — Z3200 Encounter for pregnancy test, result unknown: Secondary | ICD-10-CM

## 2022-09-14 DIAGNOSIS — B977 Papillomavirus as the cause of diseases classified elsewhere: Secondary | ICD-10-CM | POA: Diagnosis not present

## 2022-09-14 DIAGNOSIS — Z3202 Encounter for pregnancy test, result negative: Secondary | ICD-10-CM

## 2022-09-14 DIAGNOSIS — R87612 Low grade squamous intraepithelial lesion on cytologic smear of cervix (LGSIL): Secondary | ICD-10-CM

## 2022-09-14 LAB — POCT URINE PREGNANCY: Preg Test, Ur: NEGATIVE

## 2022-09-14 NOTE — Progress Notes (Signed)
   Established Patient Office Visit  Subjective   Patient ID: Mary Morrow, female    DOB: 2001-07-09  Age: 22 y.o. MRN: [id number]  Chief Complaint  Patient presents with   Colposcopy    HPI   This 22 yo single G0 is here for a colpo after having a LGSIL pap 04/2022. She has not been sexually active for about a year but she takes OCPs for acne and menstrual regulation. 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 ROS- She works at [employer] at the present and lives at home. She attended [employer] in the past.  Objective:     BP 110/70   Ht 5\' 5"  (1.651 m)   Wt 143 lb (64.9 kg)   LMP 09/05/2022   BMI 23.80 kg/m    Physical Exam   Results for orders placed or performed in visit on 09/14/22  POCT urine pregnancy  Result Value Ref Range   Preg Test, Ur Negative Negative      Well nourished, well hydrated Black female, no apparent distress She is ambulating and conversing normally. UPT negative, consent signed, time out done Cervix prepped with acetic acid. Transformation zone seen in its entirety. Colpo adequate. Colpo findings all normal. She tolerated the procedure well.  Assessment & Plan:   Problem List Items Addressed This Visit     LGSIL on Pap smear of cervix   Other Visit Diagnoses     Possible pregnancy    -  Primary   Relevant Orders   POCT urine pregnancy (Completed)   High risk HPV infection           She will get a pap smear in a year.   Emily Filbert, MD

## 2022-09-15 ENCOUNTER — Encounter: Payer: Self-pay | Admitting: Obstetrics & Gynecology

## 2023-04-05 ENCOUNTER — Other Ambulatory Visit: Payer: Self-pay | Admitting: Family Medicine

## 2023-04-06 NOTE — Telephone Encounter (Signed)
Med refilled once, please schedule CPE appt on or after 05/10/23, thanks

## 2023-04-06 NOTE — Telephone Encounter (Signed)
Spoke to pt, scheduled cpe for 05/11/23

## 2023-04-12 ENCOUNTER — Encounter (INDEPENDENT_AMBULATORY_CARE_PROVIDER_SITE_OTHER): Payer: Self-pay

## 2023-05-11 ENCOUNTER — Ambulatory Visit (INDEPENDENT_AMBULATORY_CARE_PROVIDER_SITE_OTHER): Payer: Managed Care, Other (non HMO) | Admitting: Family Medicine

## 2023-05-11 ENCOUNTER — Encounter: Payer: Self-pay | Admitting: Family Medicine

## 2023-05-11 VITALS — BP 108/74 | HR 85 | Temp 97.8°F | Ht 64.5 in | Wt 144.0 lb

## 2023-05-11 DIAGNOSIS — Z0000 Encounter for general adult medical examination without abnormal findings: Secondary | ICD-10-CM | POA: Diagnosis not present

## 2023-05-11 DIAGNOSIS — R87612 Low grade squamous intraepithelial lesion on cytologic smear of cervix (LGSIL): Secondary | ICD-10-CM

## 2023-05-11 DIAGNOSIS — D696 Thrombocytopenia, unspecified: Secondary | ICD-10-CM | POA: Diagnosis not present

## 2023-05-11 LAB — TSH: TSH: 1.03 u[IU]/mL (ref 0.35–5.50)

## 2023-05-11 LAB — LIPID PANEL
Cholesterol: 176 mg/dL (ref 0–200)
HDL: 59.8 mg/dL (ref >39.00)
LDL Cholesterol: 99 mg/dL (ref 0–99)
NonHDL: 115.72
Total CHOL/HDL Ratio: 3
Triglycerides: 82 mg/dL (ref 0.0–149.0)
VLDL: 16.4 mg/dL (ref 0.0–40.0)

## 2023-05-11 LAB — COMPREHENSIVE METABOLIC PANEL
ALT: 12 U/L (ref 0–35)
AST: 20 U/L (ref 0–37)
Albumin: 4.1 g/dL (ref 3.5–5.2)
Alkaline Phosphatase: 35 U/L — ABNORMAL LOW (ref 39–117)
BUN: 10 mg/dL (ref 6–23)
CO2: 27 meq/L (ref 19–32)
Calcium: 9.6 mg/dL (ref 8.4–10.5)
Chloride: 103 meq/L (ref 96–112)
Creatinine, Ser: 0.8 mg/dL (ref 0.40–1.20)
GFR: 103.93 mL/min (ref >60.00)
Glucose, Bld: 72 mg/dL (ref 70–99)
Potassium: 4.2 meq/L (ref 3.5–5.1)
Sodium: 137 meq/L (ref 135–145)
Total Bilirubin: 0.5 mg/dL (ref 0.2–1.2)
Total Protein: 7.8 g/dL (ref 6.0–8.3)

## 2023-05-11 LAB — CBC WITH DIFFERENTIAL/PLATELET
Basophils Absolute: 0 10*3/uL (ref 0.0–0.1)
Basophils Relative: 0.4 % (ref 0.0–3.0)
Eosinophils Absolute: 0.1 10*3/uL (ref 0.0–0.7)
Eosinophils Relative: 1.9 % (ref 0.0–5.0)
HCT: 38.6 % (ref 36.0–46.0)
Hemoglobin: 12.4 g/dL (ref 12.0–15.0)
Lymphocytes Relative: 43.2 % (ref 12.0–46.0)
Lymphs Abs: 1.6 10*3/uL (ref 0.7–4.0)
MCHC: 32.1 g/dL (ref 30.0–36.0)
MCV: 90.4 fl (ref 78.0–100.0)
Monocytes Absolute: 0.2 10*3/uL (ref 0.1–1.0)
Monocytes Relative: 4.2 % (ref 3.0–12.0)
Neutro Abs: 1.9 10*3/uL (ref 1.4–7.7)
Neutrophils Relative %: 50.3 % (ref 43.0–77.0)
Platelets: 191 10*3/uL (ref 150.0–400.0)
RBC: 4.27 Mil/uL (ref 3.87–5.11)
RDW: 13.1 % (ref 11.5–15.5)
WBC: 3.7 10*3/uL — ABNORMAL LOW (ref 4.0–10.5)

## 2023-05-11 NOTE — Patient Instructions (Addendum)
Add some strength training to your routine, this is important for bone and brain health and can reduce your risk of falls and help your body use insulin properly and regulate weight  Light weights, exercise bands , and internet videos are a good way to start  Yoga (chair or regular), machines , floor exercises or a gym with machines are also good options   If you want to stop the oral contraceptive- stop with next period (don't start if back)   Try and eat a healthy diet  Try to get most of your carbohydrates from produce (with the exception of white potatoes) and whole grains Eat less bread/pasta/rice/snack foods/cereals/sweets and other items from the middle of the grocery store (processed carbs)  Labs today

## 2023-05-11 NOTE — Assessment & Plan Note (Signed)
No new bruising or bleeding  Cbc today

## 2023-05-11 NOTE — Assessment & Plan Note (Signed)
History of this in past   Colpo normal in jan 2024  Will need next pap jan 2025 Sees gyn Dr Marice Potter Has had HPV imms

## 2023-05-11 NOTE — Progress Notes (Unsigned)
Subjective:    Patient ID: Mary Morrow, female    DOB: [birth year], 22 y.o.   MRN: [id number]  HPI  Here for health maintenance exam and to review chronic medical problems   Wt Readings from Last 3 Encounters:  05/11/23 144 lb (65.3 kg)  09/14/22 143 lb (64.9 kg)  05/08/22 148 lb 2 oz (67.2 kg)   24.34 kg/m  Vitals:   05/11/23 0951  BP: 108/74  Pulse: 85  Temp: 97.8 F (36.6 C)  SpO2: 98%    Immunization History  Administered Date(s) Administered   DTaP 03/19/2000, 05/22/2000, 07/27/2000, 04/23/2001, 10/17/2004   HIB (PRP-OMP) 03/19/2000, 05/22/2000, 07/27/2000, 04/23/2001   HPV 9-valent 04/15/2020, 06/16/2020, 05/08/2022   Hepatitis A, Ped/Adol-2 Dose 10/02/2007, 11/10/2010   Hepatitis B, PED/ADOLESCENT 2000/01/09, 02/20/2000, 10/23/2000   IPV 03/19/2000, 05/22/2000, 10/23/2000, 10/17/2004   MMR 01/18/2001, 10/17/2004   Meningococcal Mcv4o 02/07/2017   PFIZER(Purple Top)SARS-COV-2 Vaccination 02/10/2020, 03/02/2020   Pneumococcal Conjugate-13 03/19/2000, 05/22/2000, 07/27/2000, 01/18/2001   Tdap 11/10/2010, 05/08/2022   Varicella 01/18/2001, 10/17/2004, 10/02/2006    There are no preventive care reminders to display for this patient.  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  Was pca - moving up from there   Taking care of herself   Flu shot -declines   Self breast exam: no lumps  No breast cancer in the family    Gyn health Pap 04/2022  LSIL with hpv - ref to gyn and had normal colpo in January-will be due for pap next January   Menses-ok  Very regular  4-5 days  Not heavy after first few days  On OC 1/20-not currently sexually active and wants to stop it    Std screening : none needed     Bone health   Falls-none Fractures-none  Supplements - none  Exercise : working out at home  Uses some weights / squats/ sit ups   Eats pretty healthy  Balanced diet    Mood    05/11/2023    9:51 AM 05/08/2022   10:35 AM  04/15/2020   11:17 AM 02/12/2019    9:04 AM 02/08/2018    4:44 PM  Depression screen PHQ 2/9  Decreased Interest 0 0 0 0 0  Down, Depressed, Hopeless 0 0 0 0 0  PHQ - 2 Score 0 0 0 0 0  Altered sleeping   0 0   Tired, decreased energy   0 0   Change in appetite   0 0   Feeling bad or failure about yourself    0 0   Trouble concentrating   0 0   Moving slowly or fidgety/restless   0 0   Suicidal thoughts   0 0   PHQ-9 Score   0 0   Difficult doing work/chores   Not difficult at all     Lab Results  Component Value Date   WBC 3.3 (L) 05/08/2022   HGB 12.6 05/08/2022   HCT 37.9 05/08/2022   MCV 90.5 05/08/2022   PLT 136.0 (L) 05/08/2022   Mild low plt in past    Lab Results  Component Value Date   CHOL 181 05/08/2022   HDL 64.40 05/08/2022   LDLCALC 101 (H) 05/08/2022   TRIG 79.0 05/08/2022   CHOLHDL 3 05/08/2022       Patient Active Problem List   Diagnosis Date Noted   LGSIL on Pap smear of cervix 05/17/2022   Encounter for routine gynecological  examination 05/08/2022   Thrombocytopenia (HCC) 05/08/2022   Screening examination for STD (sexually transmitted disease) 02/12/2019   Routine general medical examination at a health care facility 02/08/2018   Well adolescent visit 08/05/2014   History reviewed. No pertinent past medical history. History reviewed. No pertinent surgical history. Social History   Tobacco Use   Smoking status: Never   Smokeless tobacco: Never  Substance Use Topics   Alcohol use: No    Alcohol/week: 0.0 standard drinks of alcohol   Drug use: No   Family History  Problem Relation Age of Onset   Hypertension Maternal Grandmother    Hyperlipidemia Maternal Uncle    Mental illness Maternal Aunt    Mental illness Maternal Uncle    Diabetes Other        maternal great uncle   Diabetes Maternal Grandfather    No Known Allergies Current Outpatient Medications on File Prior to Visit  Medication Sig Dispense Refill    levonorgestrel-ethinyl estradiol (SRONYX) 0.1-20 MG-MCG tablet TAKE 1 TABLET BY MOUTH EVERY DAY 84 tablet 0   No current facility-administered medications on file prior to visit.    Review of Systems  Constitutional:  Negative for activity change, appetite change, fatigue, fever and unexpected weight change.  HENT:  Negative for congestion, ear pain, rhinorrhea, sinus pressure and sore throat.   Eyes:  Negative for pain, redness and visual disturbance.  Respiratory:  Negative for cough, shortness of breath and wheezing.   Cardiovascular:  Negative for chest pain and palpitations.  Gastrointestinal:  Negative for abdominal pain, blood in stool, constipation and diarrhea.  Endocrine: Negative for polydipsia and polyuria.  Genitourinary:  Negative for dysuria, frequency and urgency.  Musculoskeletal:  Negative for arthralgias, back pain and myalgias.  Skin:  Negative for pallor and rash.  Allergic/Immunologic: Negative for environmental allergies.  Neurological:  Negative for dizziness, syncope and headaches.  Hematological:  Negative for adenopathy. Does not bruise/bleed easily.  Psychiatric/Behavioral:  Negative for decreased concentration and dysphoric mood. The patient is not nervous/anxious.        Objective:   Physical Exam Constitutional:      General: She is not in acute distress.    Appearance: Normal appearance. She is well-developed and normal weight. She is not ill-appearing or diaphoretic.  HENT:     Head: Normocephalic and atraumatic.     Right Ear: Tympanic membrane, ear canal and external ear normal.     Left Ear: Tympanic membrane, ear canal and external ear normal.     Nose: Nose normal. No congestion.     Mouth/Throat:     Mouth: Mucous membranes are moist.     Pharynx: Oropharynx is clear. No posterior oropharyngeal erythema.  Eyes:     General: No scleral icterus.    Extraocular Movements: Extraocular movements intact.     Conjunctiva/sclera: Conjunctivae  normal.     Pupils: Pupils are equal, round, and reactive to light.  Neck:     Thyroid: No thyromegaly.     Vascular: No carotid bruit or JVD.  Cardiovascular:     Rate and Rhythm: Normal rate and regular rhythm.     Pulses: Normal pulses.     Heart sounds: Normal heart sounds.     No gallop.  Pulmonary:     Effort: Pulmonary effort is normal. No respiratory distress.     Breath sounds: Normal breath sounds. No wheezing.     Comments: Good air exch Chest:     Chest wall: No tenderness.  Abdominal:     General: Bowel sounds are normal. There is no distension or abdominal bruit.     Palpations: Abdomen is soft. There is no mass.     Tenderness: There is no abdominal tenderness.     Hernia: No hernia is present.  Genitourinary:    Comments: Breast exam: No mass, nodules, thickening, tenderness, bulging, retraction, inflamation, nipple discharge or skin changes noted.  No axillary or clavicular LA.     Musculoskeletal:        General: No tenderness. Normal range of motion.     Cervical back: Normal range of motion and neck supple. No rigidity. No muscular tenderness.     Right lower leg: No edema.     Left lower leg: No edema.     Comments: No kyphosis   Lymphadenopathy:     Cervical: No cervical adenopathy.  Skin:    General: Skin is warm and dry.     Coloration: Skin is not pale.     Findings: No erythema or rash.  Neurological:     Mental Status: She is alert. Mental status is at baseline.     Cranial Nerves: No cranial nerve deficit.     Motor: No abnormal muscle tone.     Coordination: Coordination normal.     Gait: Gait normal.     Deep Tendon Reflexes: Reflexes are normal and symmetric. Reflexes normal.  Psychiatric:        Mood and Affect: Mood normal.        Cognition and Memory: Cognition and memory normal.           Assessment & Plan:   Problem List Items Addressed This Visit       Hematopoietic and Hemostatic   Thrombocytopenia (HCC)    No new  bruising or bleeding  Cbc today      Relevant Orders   CBC with Differential/Platelet     Other   Routine general medical examination at a health care facility - Primary (Chronic)    Reviewed health habits including diet and exercise and skin cancer prevention Reviewed appropriate screening tests for age  Also reviewed health mt list, fam hx and immunization status , as well as social and family history   See HPI Labs reviewed and ordered [facility] gyn care/ colp normal in jan /will need pap in [place]  Plans to come off of oc /not needed for contraception currently  Declines std screen/not active  Encouraged good diet and exercise habits  Phq of 0 Encouraged self breast exams  Declines flu shot       Relevant Orders   TSH   Lipid panel   Comprehensive metabolic panel   CBC with Differential/Platelet   LGSIL on Pap smear of cervix    History of this in past   Colpo normal in jan 2024  Will need next pap jan 2025 Sees gyn Dr Marice Potter Has had HPV imms

## 2023-05-11 NOTE — Assessment & Plan Note (Signed)
Reviewed health habits including diet and exercise and skin cancer prevention Reviewed appropriate screening tests for age  Also reviewed health mt list, fam hx and immunization status , as well as social and family history   See HPI Labs reviewed and ordered [facility] gyn care/ colp normal in jan /will need pap in [place]  Plans to come off of oc /not needed for contraception currently  Declines std screen/not active  Encouraged good diet and exercise habits  Phq of 0 Encouraged self breast exams  Declines flu shot

## 2023-05-14 ENCOUNTER — Encounter: Payer: Self-pay | Admitting: *Deleted

## 2023-06-24 ENCOUNTER — Other Ambulatory Visit: Payer: Self-pay | Admitting: Family Medicine

## 2024-09-19 ENCOUNTER — Other Ambulatory Visit (HOSPITAL_COMMUNITY)
Admission: RE | Admit: 2024-09-19 | Discharge: 2024-09-19 | Disposition: A | Source: Ambulatory Visit | Attending: Family Medicine | Admitting: Family Medicine

## 2024-09-19 ENCOUNTER — Encounter: Payer: Self-pay | Admitting: Family Medicine

## 2024-09-19 ENCOUNTER — Ambulatory Visit (INDEPENDENT_AMBULATORY_CARE_PROVIDER_SITE_OTHER): Payer: Self-pay | Admitting: Family Medicine

## 2024-09-19 VITALS — BP 112/72 | HR 59 | Temp 97.9°F | Ht 65.0 in | Wt 137.1 lb

## 2024-09-19 DIAGNOSIS — Z01419 Encounter for gynecological examination (general) (routine) without abnormal findings: Secondary | ICD-10-CM | POA: Diagnosis present

## 2024-09-19 DIAGNOSIS — Z113 Encounter for screening for infections with a predominantly sexual mode of transmission: Secondary | ICD-10-CM

## 2024-09-19 DIAGNOSIS — Z13220 Encounter for screening for lipoid disorders: Secondary | ICD-10-CM | POA: Diagnosis not present

## 2024-09-19 DIAGNOSIS — D696 Thrombocytopenia, unspecified: Secondary | ICD-10-CM | POA: Diagnosis not present

## 2024-09-19 DIAGNOSIS — Z0000 Encounter for general adult medical examination without abnormal findings: Secondary | ICD-10-CM | POA: Diagnosis not present

## 2024-09-19 LAB — CBC WITH DIFFERENTIAL/PLATELET
Basophils Absolute: 0 K/uL (ref 0.0–0.1)
Basophils Relative: 0.8 % (ref 0.0–3.0)
Eosinophils Absolute: 0.1 K/uL (ref 0.0–0.7)
Eosinophils Relative: 2.2 % (ref 0.0–5.0)
HCT: 36.7 % (ref 36.0–46.0)
Hemoglobin: 12 g/dL (ref 12.0–15.0)
Lymphocytes Relative: 54.9 % — ABNORMAL HIGH (ref 12.0–46.0)
Lymphs Abs: 1.7 K/uL (ref 0.7–4.0)
MCHC: 32.7 g/dL (ref 30.0–36.0)
MCV: 91.1 fl (ref 78.0–100.0)
Monocytes Absolute: 0.2 K/uL (ref 0.1–1.0)
Monocytes Relative: 6.4 % (ref 3.0–12.0)
Neutro Abs: 1.1 K/uL — ABNORMAL LOW (ref 1.4–7.7)
Neutrophils Relative %: 35.7 % — ABNORMAL LOW (ref 43.0–77.0)
Platelets: 158 K/uL (ref 150.0–400.0)
RBC: 4.03 Mil/uL (ref 3.87–5.11)
RDW: 13 % (ref 11.5–15.5)
WBC: 3 K/uL — ABNORMAL LOW (ref 4.0–10.5)

## 2024-09-19 LAB — LIPID PANEL
Cholesterol: 167 mg/dL (ref 28–200)
HDL: 78.6 mg/dL
LDL Cholesterol: 82 mg/dL (ref 10–99)
NonHDL: 88.83
Total CHOL/HDL Ratio: 2
Triglycerides: 35 mg/dL (ref 10.0–149.0)
VLDL: 7 mg/dL (ref 0.0–40.0)

## 2024-09-19 LAB — COMPREHENSIVE METABOLIC PANEL WITH GFR
ALT: 15 U/L (ref 3–35)
AST: 23 U/L (ref 5–37)
Albumin: 4.5 g/dL (ref 3.5–5.2)
Alkaline Phosphatase: 42 U/L (ref 39–117)
BUN: 12 mg/dL (ref 6–23)
CO2: 29 meq/L (ref 19–32)
Calcium: 9.7 mg/dL (ref 8.4–10.5)
Chloride: 103 meq/L (ref 96–112)
Creatinine, Ser: 0.73 mg/dL (ref 0.40–1.20)
GFR: 114.9 mL/min
Glucose, Bld: 81 mg/dL (ref 70–99)
Potassium: 3.8 meq/L (ref 3.5–5.1)
Sodium: 137 meq/L (ref 135–145)
Total Bilirubin: 0.4 mg/dL (ref 0.2–1.2)
Total Protein: 8.4 g/dL — ABNORMAL HIGH (ref 6.0–8.3)

## 2024-09-19 NOTE — Patient Instructions (Addendum)
 If you need contraception (birth control) please let us  know  Use condoms every time   STD screening and wellness labs today    Eat a healthy balanced diet   Try to fit in exercise  Add some strength training to your routine, this is important for bone and brain health and can reduce your risk of falls and help your body use insulin properly and regulate weight  Light weights, exercise bands , and internet videos are a good way to start  Yoga (chair or regular), machines , floor exercises or a gym with machines are also good options   Protein sources  The following are examples of protein in diet  Meat -lean Fish  Eggs  Dairy products  Soy products  Oat milk  Almond milk Nuts and nut butters  Legumes  Dried beans

## 2024-09-19 NOTE — Assessment & Plan Note (Signed)
 Reviewed health habits including diet and exercise and skin cancer prevention Reviewed appropriate screening tests for age  Also reviewed health mt list, fam hx and immunization status , as well as social and family history   See HPI Labs reviewed and ordered Health Maintenance  Topic Date Due   Flu Shot  11/25/2024*   COVID-19 Vaccine (3 - 2025-26 season) 04/27/2025*   Pap Smear  05/08/2025   DTaP/Tdap/Td vaccine (8 - Td or Tdap) 05/08/2032   Pneumococcal Vaccine  Completed   Hepatitis B Vaccine  Completed   HPV Vaccine  Completed   Hepatitis C Screening  Completed   HIV Screening  Completed   Meningitis B Vaccine  Aged Out  *Topic was postponed. The date shown is not the original due date.    Declines contraception  Encouraged use of condoms Encouraged flu shot when returns to work Gyn exam /pap done  Discussed fall prevention, supplements and exercise for bone density  PHQ 0

## 2024-09-19 NOTE — Assessment & Plan Note (Signed)
 Past history of HPV and LGSIL in past  Followed by colposcopy (normal per pt) Pap today Difficult exam- speculum is uncomfortable (per pt baseline) STD screen sent Declines contraception  Discussed importance of condom use for this and STD prevention

## 2024-09-19 NOTE — Assessment & Plan Note (Signed)
 No symptoms  Encouraged safe sexual practices Lab ordered

## 2024-09-19 NOTE — Progress Notes (Signed)
 "  Subjective:    Patient ID: Mary Morrow, female    DOB: 07/27/2001, 24 y.o.   MRN: [id number]  HPI  Here for health maintenance exam and to review chronic medical problems   Wt Readings from Last 3 Encounters:  09/19/24 137 lb 2 oz (62.2 kg)  05/11/23 144 lb (65.3 kg)  09/14/22 143 lb (64.9 kg)   22.82 kg/m  Vitals:   09/19/24 1138  BP: 112/72  Pulse: (!) 59  Temp: 97.9 F (36.6 C)  SpO2: 100%    Immunization History  Administered Date(s) Administered   DTaP 03/19/2000, 05/22/2000, 07/27/2000, 04/23/2001, 10/17/2004   HIB (PRP-OMP) 03/19/2000, 05/22/2000, 07/27/2000, 04/23/2001   HPV 9-valent 04/15/2020, 06/16/2020, 05/08/2022   Hepatitis A, Ped/Adol-2 Dose 10/02/2007, 11/10/2010   Hepatitis B, PED/ADOLESCENT 01-Dec-1999, 02/20/2000, 10/23/2000   IPV 03/19/2000, 05/22/2000, 10/23/2000, 10/17/2004   MMR 01/18/2001, 10/17/2004   Meningococcal Mcv4o 02/07/2017   PFIZER(Purple Top)SARS-COV-2 Vaccination 02/10/2020, 03/02/2020   Pneumococcal Conjugate-13 03/19/2000, 05/22/2000, 07/27/2000, 01/18/2001   Tdap 11/10/2010, 05/08/2022   Varicella 01/18/2001, 10/17/2004, 10/02/2006    There are no preventive care reminders to display for this patient.  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 , med Conservation Officer, [employer] it / learning a lot  Wants to become a nurse eventually    Flu shot -will be getting it at work  Had neg TB test    Self breast exam-no lumps or changes   Gyn health Pap 04/2022 - HPV positive with LGSIL-gyn referral was done  Colp normal 08/2022 in gyn office   Great grandparent with breast cancer   Has had HPV imms   Menses Monthly -but not super regular  Vary in terms of bleeding or cramping   Cycle varies  Is sexually active -one partner /infrequently   STD screen - wants today    Colon cancer screening -no colon cancer in family   Bone health   Falls-none  Fractures-none  Supplements -none   Eats a balanced diet    Exercise  Was running before weather changes    Mood    09/19/2024   11:41 AM 05/11/2023    9:51 AM 05/08/2022   10:35 AM 04/15/2020   11:17 AM 02/12/2019    9:04 AM  Depression screen PHQ 2/9  Decreased Interest 0 0 0 0 0  Down, Depressed, Hopeless 0 0 0 0 0  PHQ - 2 Score 0 0 0 0 0  Altered sleeping 0   0 0  Tired, decreased energy 0   0 0  Change in appetite 0   0 0  Feeling bad or failure about yourself  0   0 0  Trouble concentrating 0   0 0  Moving slowly or fidgety/restless 0   0 0  Suicidal thoughts 0   0 0  PHQ-9 Score 0   0  0   Difficult doing work/chores Not difficult at all   Not difficult at all      Data saved with a previous flowsheet row definition       Patient Active Problem List   Diagnosis Date Noted   Screen for STD (sexually transmitted disease) 09/19/2024   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 No pertinent past medical history.  History reviewed. No pertinent surgical history. Social History[1] Family History  Problem Relation Age of Onset   Hypertension Maternal Grandmother    Hyperlipidemia Maternal Uncle    Mental illness Maternal Aunt    Mental illness Maternal Uncle    Diabetes Other        maternal great uncle   Diabetes Maternal Grandfather    Allergies[2] Medications Ordered Prior to Encounter[3]  Review of Systems  Constitutional:  Negative for activity change, appetite change, fatigue, fever and unexpected weight change.  HENT:  Negative for congestion, ear pain, rhinorrhea, sinus pressure and sore throat.   Eyes:  Negative for pain, redness and visual disturbance.  Respiratory:  Negative for cough, shortness of breath and wheezing.   Cardiovascular:  Negative for chest pain and palpitations.  Gastrointestinal:   Negative for abdominal pain, blood in stool, constipation and diarrhea.  Endocrine: Negative for polydipsia and polyuria.  Genitourinary:  Positive for menstrual problem. Negative for dysuria, frequency, pelvic pain, urgency and vaginal discharge.  Musculoskeletal:  Negative for arthralgias, back pain and myalgias.  Skin:  Negative for pallor and rash.  Allergic/Immunologic: Negative for environmental allergies.  Neurological:  Negative for dizziness, syncope and headaches.  Hematological:  Negative for adenopathy. Does not bruise/bleed easily.  Psychiatric/Behavioral:  Negative for decreased concentration and dysphoric mood. The patient is not nervous/anxious.        Objective:   Physical Exam Constitutional:      General: She is not in acute distress.    Appearance: Normal appearance. She is well-developed and normal weight. She is not ill-appearing or diaphoretic.  HENT:     Head: Normocephalic and atraumatic.     Right Ear: Tympanic membrane, ear canal and external ear normal.     Left Ear: Tympanic membrane, ear canal and external ear normal.     Nose: Nose normal. No congestion.     Mouth/Throat:     Mouth: Mucous membranes are moist.     Pharynx: Oropharynx is clear. No posterior oropharyngeal erythema.  Eyes:     General: No scleral icterus.    Extraocular Movements: Extraocular movements intact.     Conjunctiva/sclera: Conjunctivae normal.     Pupils: Pupils are equal, round, and reactive to light.  Neck:     Thyroid : No thyromegaly.     Vascular: No carotid bruit or JVD.  Cardiovascular:     Rate and Rhythm: Normal rate and regular rhythm.     Pulses: Normal pulses.     Heart sounds: Normal heart sounds.     No gallop.  Pulmonary:     Effort: Pulmonary effort is normal. No respiratory distress.     Breath sounds: Normal breath sounds. No wheezing.     Comments: Good air exch Chest:     Chest wall: No tenderness.  Abdominal:     General: Bowel sounds are normal.  There is no distension or abdominal bruit.     Palpations: Abdomen is soft. There is no mass.     Tenderness: There is no abdominal tenderness.     Hernia: No hernia is present.  Genitourinary:    Comments: Breast exam: No mass, nodules, thickening, tenderness, bulging, retraction, inflamation, nipple discharge or skin changes noted.  No axillary or clavicular LA.                   Anus appears normal w/o hemorrhoids or masses       External genitalia : nl appearance and hair distribution/no lesions  Urethral meatus : nl size, no lesions or prolapse       Urethra: no masses, tenderness or scarring      Bladder : no masses or tenderness    Pt has significant discomfort with speculum (baseline per pt) and unable to open enough to see well    Vagina: nl general appearance or  Lesions, no significant cystocele  or rectocele   , some pale discharge    Cervix: no lesions/ discharge or friability      Uterus: nl size, contour, position, and mobility (not fixed) , non tender      Adnexa : no masses, tenderness, enlargement or nodularity          Musculoskeletal:        General: No tenderness. Normal range of motion.     Cervical back: Normal range of motion and neck supple. No rigidity. No muscular tenderness.     Right lower leg: No edema.     Left lower leg: No edema.     Comments: No kyphosis   Lymphadenopathy:     Cervical: No cervical adenopathy.  Skin:    General: Skin is warm and dry.     Coloration: Skin is not pale.     Findings: No erythema or rash.  Neurological:     Mental Status: She is alert. Mental status is at baseline.     Cranial Nerves: No cranial nerve deficit.     Motor: No abnormal muscle tone.     Coordination: Coordination normal.     Gait: Gait normal.     Deep Tendon Reflexes: Reflexes are normal and symmetric. Reflexes normal.  Psychiatric:        Mood and Affect: Mood normal.        Cognition and Memory: Cognition and memory normal.            Assessment & Plan:   Problem List Items Addressed This Visit       Hematopoietic and Hemostatic   Thrombocytopenia   No symptoms Cbc today      Relevant Orders   CBC with Differential/Platelet     Other   Routine general medical examination at a health care facility - Primary (Chronic)   Reviewed health habits including diet and exercise and skin cancer prevention Reviewed appropriate screening tests for age  Also reviewed health mt list, fam hx and immunization status , as well as social and family history   See HPI Labs reviewed and ordered Health Maintenance  Topic Date Due   Flu Shot  11/25/2024*   COVID-19 Vaccine (3 - 2025-26 season) 04/27/2025*   Pap Smear  05/08/2025   DTaP/Tdap/Td vaccine (8 - Td or Tdap) 05/08/2032   Pneumococcal Vaccine  Completed   Hepatitis B Vaccine  Completed   HPV Vaccine  Completed   Hepatitis C Screening  Completed   HIV Screening  Completed   Meningitis B Vaccine  Aged Out  *Topic was postponed. The date shown is not the original due date.    Declines contraception  Encouraged use of condoms Encouraged flu shot when returns to work Gyn exam /pap done  Discussed fall prevention, supplements and exercise for bone density  PHQ 0       Relevant Orders   CBC with Differential/Platelet   Comprehensive metabolic panel with GFR   Lipid Panel   TSH   Screen for STD (sexually transmitted disease)   No symptoms  Encouraged safe sexual practices Lab ordered  Relevant Orders   Hepatitis C antibody   HIV Antibody (routine testing w rflx)   RPR W/RFLX TO RPR TITER, TREPONEMAL AB, SCREEN AND DIAGNOSIS   Encounter for routine gynecological examination   Past history of HPV and LGSIL in past  Followed by colposcopy (normal per pt) Pap today Difficult exam- speculum is uncomfortable (per pt baseline) STD screen sent Declines contraception  Discussed importance of condom use for this and STD prevention       Relevant Orders    Cytology - PAP([facility])      [1]  Social History Tobacco Use   Smoking status: Never   Smokeless tobacco: Never  Substance Use Topics   Alcohol use: No    Alcohol/week: 0.0 standard drinks of alcohol   Drug use: No  [2] No Known Allergies [3]  No current outpatient medications on file prior to visit.   No current facility-administered medications on file prior to visit.   "

## 2024-09-19 NOTE — Assessment & Plan Note (Signed)
No symptoms  Cbc today

## 2024-09-20 LAB — SYPHILIS: RPR W/REFLEX TO RPR TITER AND TREPONEMAL ANTIBODIES, TRADITIONAL SCREENING AND DIAGNOSIS ALGORITHM: RPR Ser Ql: NONREACTIVE

## 2024-09-21 ENCOUNTER — Ambulatory Visit: Payer: Self-pay | Admitting: Family Medicine

## 2024-09-24 LAB — CYTOLOGY - PAP
Chlamydia: NEGATIVE
Comment: NEGATIVE
Comment: NEGATIVE
Comment: NEGATIVE
Comment: NORMAL
Diagnosis: NEGATIVE
High risk HPV: NEGATIVE
Neisseria Gonorrhea: NEGATIVE
Trichomonas: NEGATIVE

## 2024-09-26 LAB — HIV ANTIBODY (ROUTINE TESTING W REFLEX)
HIV 1&2 Ab, 4th Generation: NONREACTIVE
HIV FINAL INTERPRETATION: NEGATIVE

## 2024-09-26 LAB — TSH: TSH: 1.22 m[IU]/L

## 2024-09-26 LAB — HCV RNA,QUANTITATIVE REAL TIME PCR
HCV Quantitative Log: <1.18 {Log_IU}/mL
HCV RNA, PCR, QN: <15 [IU]/mL

## 2024-09-26 LAB — SYPHILIS: RPR W/REFLEX TO RPR TITER AND TREPONEMAL ANTIBODIES, TRADITIONAL SCREENING AND DIAGNOSIS ALGORITHM: RPR Ser Ql: NONREACTIVE

## 2024-09-26 LAB — HYPERGLYCOSYLATED HCG (H-HCG): HYPERGLYCOSYLATED HCG (H HCG): <0.5 ug/L

## 2024-09-26 LAB — HEPATITIS C ANTIBODY: Hepatitis C Ab: REACTIVE — AB
